# Patient Record
Sex: Male | Born: 1995 | Hispanic: Refuse to answer | Marital: Single | Smoking: Never smoker
Health system: Southern US, Community
[De-identification: ages and names within clinical notes are randomized; demographics above are authoritative.]

---

## 2018-05-01 ENCOUNTER — Encounter: Payer: Self-pay | Admitting: Family Medicine

## 2018-05-01 ENCOUNTER — Ambulatory Visit
Admission: RE | Admit: 2018-05-01 | Discharge: 2018-05-01 | Disposition: A | Discharge status: Home / Self Care | Payer: BLUE CROSS/BLUE SHIELD | Attending: Family Medicine | Admitting: Family Medicine

## 2018-05-01 ENCOUNTER — Ambulatory Visit (INDEPENDENT_AMBULATORY_CARE_PROVIDER_SITE_OTHER): Payer: BLUE CROSS/BLUE SHIELD | Admitting: Family Medicine

## 2018-05-01 ENCOUNTER — Ambulatory Visit
Admission: RE | Admit: 2018-05-01 | Discharge: 2018-05-01 | Disposition: A | Discharge status: Home / Self Care | Payer: BLUE CROSS/BLUE SHIELD | Source: Ambulatory Visit | Attending: Family Medicine | Admitting: Family Medicine

## 2018-05-01 VITALS — Resp 14

## 2018-05-01 DIAGNOSIS — M25531 Pain in right wrist: Secondary | ICD-10-CM

## 2018-05-01 DIAGNOSIS — S6990XA Unspecified injury of unspecified wrist, hand and finger(s), initial encounter: Secondary | ICD-10-CM | POA: Diagnosis present

## 2018-05-01 MED ORDER — NAPROXEN 500 MG PO TABS: 500.0000 mg | ORAL_TABLET | Freq: Two times a day (BID) | ORAL | 0 refills | Status: DC

## 2018-05-02 NOTE — Progress Notes (Signed)
Patient presents with symptoms of right wrist pain. He denies any trauma to the wrist. He admits that he had some soreness around his wrist with playing tennis over the last few days (which he states is not unusual for him) but then yesterday he went to hit backhand and had increased pain along the dorsal aspect of his right mid wrist. He admits to a hx of a right scaphoid fracture several years ago which he was casted for. He denies any problems since. He has noticed some bruising and swelling of the area. He has been icing.   ROS: Negative except mentioned above. Vitals as per Epic.  GENERAL: NAD MSK: R Wrist - mild swelling and pain along the medial aspect of the wrist, no significant pain at the snuff box, mild discomfort with extension and flexion of the wrist, flexor/extensor tendons intact, nv intact  NEURO: CN II-XII grossly intact   A/P: R Wrist Injury - will get Xrays, recommend wrist splint, ice a few times a day, NSAIDs prn, can do other activity that doesn't involve right upper extremity, if symptoms don't improve into next week will consider f/u with Dr. Ardine Eng and/or further imaging. Plan discussed with patient and trainer.

## 2018-05-04 ENCOUNTER — Other Ambulatory Visit: Payer: Self-pay | Admitting: Family Medicine

## 2018-05-04 ENCOUNTER — Ambulatory Visit
Admission: RE | Admit: 2018-05-04 | Discharge: 2018-05-04 | Disposition: A | Discharge status: Home / Self Care | Payer: BLUE CROSS/BLUE SHIELD | Source: Ambulatory Visit | Attending: Family Medicine | Admitting: Family Medicine

## 2018-05-04 DIAGNOSIS — S6991XD Unspecified injury of right wrist, hand and finger(s), subsequent encounter: Secondary | ICD-10-CM

## 2018-05-29 ENCOUNTER — Encounter: Payer: Self-pay | Admitting: Family Medicine

## 2018-05-29 ENCOUNTER — Ambulatory Visit (INDEPENDENT_AMBULATORY_CARE_PROVIDER_SITE_OTHER): Payer: BLUE CROSS/BLUE SHIELD | Admitting: Family Medicine

## 2018-05-29 VITALS — BP 126/75 | HR 77 | Temp 98.3°F | Resp 14

## 2018-05-29 DIAGNOSIS — J029 Acute pharyngitis, unspecified: Secondary | ICD-10-CM

## 2018-05-29 NOTE — Progress Notes (Signed)
Patient presents today with symptoms of sore throat, fever, headache, nasal congestion, body aches, fatigue.  Patient states that he has had symptoms for the last 4 days.  He states that his sore throat is about a 6 out of 10.  He has not taken any medications today.  He denies having a history of Infectious Mono.  He denies any international travel recently.  He denies any chest pain, shortness of breath, nausea, vomiting, diarrhea, abdominal pain, neck pain, photophobia.  ROS: Negative except mentioned above. Those as per Epic. GENERAL: NAD HEENT: moderate pharyngeal erythema, no exudate, no erythema of TMs, shotty cervical LAD RESP: CTA B CARD: RRR NEURO: CN II-XII grossly intact   A/P: Pharyngitis, URI - Flu screen and Rapid strep test were negative in the office, CBC and Monospot test drawn, Tylenol/Ibuprofen as needed, rest, hydration, no athletic activity for now until lab results have been reviewed., seek medical attention if symptoms persist or worsen as discussed.

## 2018-05-30 LAB — CBC WITH DIFFERENTIAL/PLATELET
Basophils Absolute: 0 10*3/uL (ref 0.0–0.2)
Basos: 1 %
EOS (ABSOLUTE): 0.2 10*3/uL (ref 0.0–0.4)
Eos: 4 %
Hematocrit: 45.8 % (ref 37.5–51.0)
Hemoglobin: 16 g/dL (ref 13.0–17.7)
Immature Grans (Abs): 0 10*3/uL (ref 0.0–0.1)
Immature Granulocytes: 0 %
LYMPHS: 12 %
Lymphocytes Absolute: 0.5 10*3/uL — ABNORMAL LOW (ref 0.7–3.1)
MCH: 32.1 pg (ref 26.6–33.0)
MCHC: 34.9 g/dL (ref 31.5–35.7)
MCV: 92 fL (ref 79–97)
Monocytes Absolute: 0.5 10*3/uL (ref 0.1–0.9)
Monocytes: 12 %
Neutrophils Absolute: 2.8 10*3/uL (ref 1.4–7.0)
Neutrophils: 71 %
Platelets: 131 10*3/uL — ABNORMAL LOW (ref 150–450)
RBC: 4.98 x10E6/uL (ref 4.14–5.80)
RDW: 12.1 % (ref 11.6–15.4)
WBC: 3.9 10*3/uL (ref 3.4–10.8)

## 2018-05-30 LAB — MONONUCLEOSIS SCREEN: Mono Screen: NEGATIVE

## 2018-12-12 ENCOUNTER — Other Ambulatory Visit: Payer: Self-pay | Admitting: Family Medicine

## 2018-12-12 DIAGNOSIS — Z8619 Personal history of other infectious and parasitic diseases: Secondary | ICD-10-CM

## 2018-12-12 DIAGNOSIS — Z8616 Personal history of COVID-19: Secondary | ICD-10-CM

## 2018-12-14 ENCOUNTER — Ambulatory Visit (INDEPENDENT_AMBULATORY_CARE_PROVIDER_SITE_OTHER): Payer: BC Managed Care – PPO

## 2018-12-14 ENCOUNTER — Other Ambulatory Visit: Payer: Self-pay

## 2018-12-14 DIAGNOSIS — Z8616 Personal history of COVID-19: Secondary | ICD-10-CM

## 2018-12-14 DIAGNOSIS — Z8619 Personal history of other infectious and parasitic diseases: Secondary | ICD-10-CM | POA: Diagnosis not present

## 2018-12-31 ENCOUNTER — Other Ambulatory Visit: Payer: Self-pay

## 2018-12-31 ENCOUNTER — Telehealth (INDEPENDENT_AMBULATORY_CARE_PROVIDER_SITE_OTHER): Payer: BC Managed Care – PPO | Admitting: Family Medicine

## 2018-12-31 DIAGNOSIS — R5383 Other fatigue: Secondary | ICD-10-CM

## 2018-12-31 DIAGNOSIS — R0602 Shortness of breath: Secondary | ICD-10-CM

## 2018-12-31 NOTE — Progress Notes (Signed)
Virtual Visit Agreed To By Patient Patient states that he had COVID-19 at the end of June. His symptoms consisted of nasal congestion and loss of taste. He denied any cardiopulmonary symptoms or fever. He did no physical activity for about 3 weeks after his diagnosis. When he started practice at [employer] [employer] he felt he was out of shape initially. It however has been a few weeks now and still feels the same. He has been practicing 2 hours a day for a few weeks now. He describes his symptoms as feeling unusually fatigued after playing a long point. He denies any chest pain, palpitations, headache, syncope, extremity swelling/pain or shortness of breath. He denies feeling fatigued outside of athletic activity. He has been eating and sleeping well. He denies taking any new medications or supplements. He denies smoking.  Patient had a ECG and Echo recently to return back to collegiate physical activity given his history of COVID-19 this summer. No significant abnormalities identified.  Admits to family history of PE in paternal grandfather and DVT in father. Patient has not had a hypercoag. workup and doesn't feel his father has.   ROS: Negative except mentioned above. GENERAL: NAD NEURO: CN II-XII grossly intact    A/P: Fatigue with physical activity - unclear whether related to having COVID-19 over the summer, will order CT Chest given symptoms and family history, patient is to go to the ER or call 911 if symptoms worsen as discussed, will consider pulmonary and/or cardiology referral after CT results are reviewed. No athletic activity until CT results reviewed. Discussed plan with trainer as well.

## 2019-01-01 ENCOUNTER — Ambulatory Visit: Payer: PRIVATE HEALTH INSURANCE | Attending: Family Medicine

## 2019-01-01 ENCOUNTER — Ambulatory Visit: Admission: RE | Admit: 2019-01-01 | Payer: PRIVATE HEALTH INSURANCE | Source: Ambulatory Visit

## 2019-01-04 ENCOUNTER — Other Ambulatory Visit: Payer: Self-pay | Admitting: Family Medicine

## 2019-01-07 ENCOUNTER — Telehealth (INDEPENDENT_AMBULATORY_CARE_PROVIDER_SITE_OTHER): Payer: BC Managed Care – PPO | Admitting: Family Medicine

## 2019-01-07 DIAGNOSIS — Z7189 Other specified counseling: Secondary | ICD-10-CM

## 2019-01-09 ENCOUNTER — Other Ambulatory Visit: Payer: Self-pay

## 2019-01-14 ENCOUNTER — Other Ambulatory Visit: Payer: Self-pay | Admitting: Family Medicine

## 2019-01-14 DIAGNOSIS — Z8619 Personal history of other infectious and parasitic diseases: Secondary | ICD-10-CM

## 2019-01-14 DIAGNOSIS — R5383 Other fatigue: Secondary | ICD-10-CM

## 2019-01-14 DIAGNOSIS — Z8616 Personal history of COVID-19: Secondary | ICD-10-CM

## 2019-01-17 ENCOUNTER — Institutional Professional Consult (permissible substitution): Payer: PRIVATE HEALTH INSURANCE | Admitting: Pulmonary Disease

## 2019-01-18 ENCOUNTER — Other Ambulatory Visit: Payer: Self-pay

## 2019-01-18 ENCOUNTER — Ambulatory Visit (INDEPENDENT_AMBULATORY_CARE_PROVIDER_SITE_OTHER): Payer: BC Managed Care – PPO | Admitting: Internal Medicine

## 2019-01-18 ENCOUNTER — Other Ambulatory Visit
Admission: RE | Admit: 2019-01-18 | Discharge: 2019-01-18 | Disposition: A | Discharge status: Home / Self Care | Payer: BC Managed Care – PPO | Source: Ambulatory Visit | Attending: Internal Medicine | Admitting: Internal Medicine

## 2019-01-18 ENCOUNTER — Encounter: Payer: Self-pay | Admitting: Internal Medicine

## 2019-01-18 VITALS — BP 128/70 | HR 82 | Temp 98.0°F | Ht 76.0 in | Wt 189.8 lb

## 2019-01-18 DIAGNOSIS — U071 COVID-19: Secondary | ICD-10-CM | POA: Insufficient documentation

## 2019-01-18 DIAGNOSIS — J4520 Mild intermittent asthma, uncomplicated: Secondary | ICD-10-CM

## 2019-01-18 LAB — CBC WITH DIFFERENTIAL/PLATELET
Abs Immature Granulocytes: 0.02 10*3/uL (ref 0.00–0.07)
Basophils Absolute: 0.1 10*3/uL (ref 0.0–0.1)
Basophils Relative: 1 %
Eosinophils Absolute: 0.1 10*3/uL (ref 0.0–0.5)
Eosinophils Relative: 2 %
HCT: 44.8 % (ref 39.0–52.0)
Hemoglobin: 15.4 g/dL (ref 13.0–17.0)
Immature Granulocytes: 0 %
Lymphocytes Relative: 33 %
Lymphs Abs: 2.2 10*3/uL (ref 0.7–4.0)
MCH: 31.9 pg (ref 26.0–34.0)
MCHC: 34.4 g/dL (ref 30.0–36.0)
MCV: 92.8 fL (ref 80.0–100.0)
Monocytes Absolute: 0.5 10*3/uL (ref 0.1–1.0)
Monocytes Relative: 7 %
Neutro Abs: 3.8 10*3/uL (ref 1.7–7.7)
Neutrophils Relative %: 57 %
Platelets: 172 10*3/uL (ref 150–400)
RBC: 4.83 MIL/uL (ref 4.22–5.81)
RDW: 12.3 % (ref 11.5–15.5)
WBC: 6.7 10*3/uL (ref 4.0–10.5)
nRBC: 0 % (ref 0.0–0.2)

## 2019-01-18 MED ORDER — ALBUTEROL SULFATE HFA 108 (90 BASE) MCG/ACT IN AERS: 2.0000 | INHALATION_SPRAY | RESPIRATORY_TRACT | 6 refills | Status: AC | PRN

## 2019-01-18 NOTE — Progress Notes (Signed)
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 Recent diagnosis of COVID-19 infection Started off with sore throat congestion with loss of taste and smell Over the last several weeks has progressed to shortness of breath and dyspnea on exertion most notably with extreme exercise  Patient is not short of breath at rest He denies wheezing He denies cough  Patient was sent to [facility] for CT of his chest to rule out PE There was no evidence of pulmonary embolism There was no evidence of interstitial lung disease There was evidence of calcified lymph nodes in the mediastinal area No significant findings on CAT scan  Patient does have some intermittent chest pain as related with movement It is nonsustained There is no other symptoms Chest pressure relieved some of the pain  Patient does not smoke Does not do drugs Does not use alcohol No secondhand smoke exposure  No previous heart and lung disease Up-to-date on vaccinations     PAST MEDICAL HISTORY :  COVID 19 INFECTION  No Known Allergies  FAMILY HISTORY:  family history includes Cancer in his paternal aunt. SOCIAL HISTORY:  reports that he has never smoked. He has never used smokeless tobacco. He reports current alcohol use.    Review of Systems:  Gen:  Denies  fever, sweats, chills weigh loss  HEENT: Denies blurred vision, double vision, ear pain, eye pain, hearing loss, nose bleeds, sore throat Cardiac:  No dizziness, +chest pain or heaviness, chest tightness,edema, No JVD Resp:   No cough, -sputum production, +shortness of breath,-wheezing, -hemoptysis,  Gi: Denies swallowing difficulty, stomach pain, nausea or vomiting, diarrhea, constipation, bowel incontinence Gu:  Denies bladder incontinence, burning urine Ext:    Denies Joint pain, stiffness or swelling Skin: Denies  skin rash, easy bruising or bleeding or hives Endoc:  Denies polyuria, polydipsia , polyphagia or weight change Psych:   Denies depression, insomnia or hallucinations  Other:  All other systems negative   BP 128/70   Pulse 82   Temp 98 F (36.7 C) (Temporal)   Ht 6\' 4"  (1.93 m)   Wt 189 lb 12.8 oz (86.1 kg)   SpO2 97%   BMI 23.10 kg/m    SpO2: 97 %    Physical Examination:   GENERAL:NAD, no fevers, chills, no weakness no fatigue HEAD: Normocephalic, atraumatic.  EYES: PERLA, EOMI No scleral icterus.  MOUTH: Moist mucosal membrane.  EAR, NOSE, THROAT: Clear without exudates. No external lesions.  NECK: Supple.  PULMONARY: CTA B/L no wheezing, rhonchi, crackles CARDIOVASCULAR: S1 and S2. Regular rate and rhythm. No murmurs GASTROINTESTINAL: Soft, nontender, nondistended. Positive bowel sounds.  MUSCULOSKELETAL: No swelling, clubbing, or edema.  NEUROLOGIC: No gross focal neurological deficits. 5/5 strength all extremities SKIN: No ulceration, lesions, rashes, or cyanosis.  PSYCHIATRIC: Insight, judgment intact. -depression -anxiety ALL OTHER ROS ARE NEGATIVE   MEDICATIONS: I have reviewed all medications and confirmed regimen as documented  OUTSIDE CT CHEST  IMAGES reviewed by me today No evidence of pulmonary embolus There may be some areas of mosaic ism and attenuation which could represent air trapping There is some very mild calcified mediastinal adenopathy There may be some evidence of mosaic and attenuation that could represent air trapping  OUTSDIE     ASSESSMENT AND PLAN SYNOPSIS 23 year old white male with recent COVID-19 infection with  normal CT chest findings of the lungs and no PE with progressive shortness of breath and dyspnea on exertion with probable underlying chest pain from costochondritis, patient may also have underlying air trapping and reactive airways disease  At this time I recommend  albuterol 2 to 4 puffs prior to exercise and exertion I also recommend obtaining pulmonary function testing I also recommend obtaining CBC Chem-7 and LFTs to assess for fatigue  Albuterol education being provided For his costochondritis I would recommend Motrin 800 mg 3 times a day with food as needed   COVID-19 EDUCATION: The signs and symptoms of COVID-19 were discussed with the patient and how to seek care for testing.  The importance of social distancing was discussed today. Hand Washing Techniques and avoid touching face was advised.     MEDICATION ADJUSTMENTS/LABS AND TESTS ORDERED: START ALBUTEROL INH 2-4 PUFFS PRIOR TO EXERCISE CHECK LAB WORK(KIDNEYS, LIVER, check for Anemia) MOTRIN 800 mg every 8 hrs as needed with food DX of COSTOCHONDRITIS-inflammation of cartilage OBTAIN PFT(breathing Tests) Call us if chest pain persists    CURRENT MEDICATIONS REVIEWED AT LENGTH WITH PATIENT TODAY   Patient satisfied with Plan of action and management. All questions answered  Follow up in 4-6 weeks   Adriyanna Christians Patricia Pesa, M.D.  [facility]  Medical Director [place] Director [facility] Cardio-Pulmonary Department

## 2019-01-18 NOTE — Patient Instructions (Addendum)
START ALBUTEROL INH 2-4 PUFFS PRIOR TO EXERCISE  CHECK LAB WORK(KIDNEYS, LIVER, check for Anemia)  MOTRIN 800 mg every 8 hrs as needed with food DX of COSTOCHONDRITIS-inflammation of cartilage   OBTAIN PFT(breathing Tests)  Call us if chest pain persists

## 2019-01-23 NOTE — Progress Notes (Signed)
Virtual Visit Agreed To By Patient  Patient has no complaints at this time. Has not been doing any physical activity since our last virtual visit. Today's appointment was made to discuss any interval change and discuss CT results. The CT Chest did not reveal a PE. There were some other findings per the radiology reading. I have recommended he follow-up with Pulmonary to discuss further. Patient addresses understanding. Denies any questions at this time.

## 2019-02-04 ENCOUNTER — Telehealth: Payer: Self-pay

## 2019-02-04 NOTE — Telephone Encounter (Signed)
Left message to relay date/time of covid test.  02/06/2019 prior to 11:[facility]  

## 2019-02-05 ENCOUNTER — Other Ambulatory Visit: Payer: Self-pay

## 2019-02-05 NOTE — Telephone Encounter (Signed)
Left message for pt

## 2019-02-05 NOTE — Telephone Encounter (Signed)
Patient called back - pt is aware of appt date and time -pr

## 2019-02-06 ENCOUNTER — Other Ambulatory Visit: Payer: Self-pay

## 2019-02-06 ENCOUNTER — Other Ambulatory Visit
Admission: RE | Admit: 2019-02-06 | Discharge: 2019-02-06 | Disposition: A | Discharge status: Home / Self Care | Payer: BC Managed Care – PPO | Source: Ambulatory Visit | Attending: Internal Medicine | Admitting: Internal Medicine

## 2019-02-06 DIAGNOSIS — Z01812 Encounter for preprocedural laboratory examination: Secondary | ICD-10-CM | POA: Diagnosis not present

## 2019-02-06 DIAGNOSIS — Z20828 Contact with and (suspected) exposure to other viral communicable diseases: Secondary | ICD-10-CM | POA: Insufficient documentation

## 2019-02-06 NOTE — Telephone Encounter (Signed)
Pt called and stated that he is on his way for COVID Test and was late due to having class at [facility].  Pt did arrive to COVID Test and called and left message for Cardiopulmonary to place patient back on schedule for PFT 02/07/2019 at 7:00. Rhonda J Cobb

## 2019-02-06 NOTE — Telephone Encounter (Signed)
Per Katie with PAT, pt no showed covid test. Suanne Marker please advise.

## 2019-02-06 NOTE — Telephone Encounter (Signed)
PFT has been placed back on schedule for 02/07/2019 at 7:00 am with an arrival time of 6:45 am.   Pt is aware. Rhonda J Cobb

## 2019-02-07 ENCOUNTER — Ambulatory Visit: Payer: BC Managed Care – PPO | Attending: Internal Medicine

## 2019-02-07 ENCOUNTER — Ambulatory Visit: Payer: PRIVATE HEALTH INSURANCE

## 2019-02-07 DIAGNOSIS — U071 COVID-19: Secondary | ICD-10-CM | POA: Diagnosis not present

## 2019-02-07 DIAGNOSIS — J4520 Mild intermittent asthma, uncomplicated: Secondary | ICD-10-CM

## 2019-02-07 LAB — SARS CORONAVIRUS 2 (TAT 6-24 HRS): SARS Coronavirus 2: NEGATIVE

## 2019-02-07 MED ORDER — ALBUTEROL SULFATE (2.5 MG/3ML) 0.083% IN NEBU
2.5000 mg | INHALATION_SOLUTION | Freq: Once | RESPIRATORY_TRACT | Status: AC
  Administered 2019-02-07: 2.5 mg via RESPIRATORY_TRACT
  Filled 2019-02-07: qty 3

## 2019-04-30 ENCOUNTER — Ambulatory Visit: Payer: PRIVATE HEALTH INSURANCE

## 2019-05-09 ENCOUNTER — Ambulatory Visit: Payer: PRIVATE HEALTH INSURANCE

## 2019-05-16 ENCOUNTER — Ambulatory Visit: Payer: PRIVATE HEALTH INSURANCE

## 2020-01-04 IMAGING — CR DG WRIST COMPLETE 3+V*R*
1 series · 4 of 4 positions shown · non-contrast
Comparison: None.

CLINICAL DATA: Pt states he was playing tennis yesterday and did a
backhand motion and started having pain middle of right wrist. Pt
states he had a stress fracure of the scaphoid 7 years ago.
shieldedtennis player mid wrist pain after backhand motion, evaluate
for bony abnormality

EXAM:
RIGHT WRIST - COMPLETE 3+ VIEW

[Series 1: dg wrist complete right · 0.14mm/px · 4 of 4 slices shown]
[im 1/4]
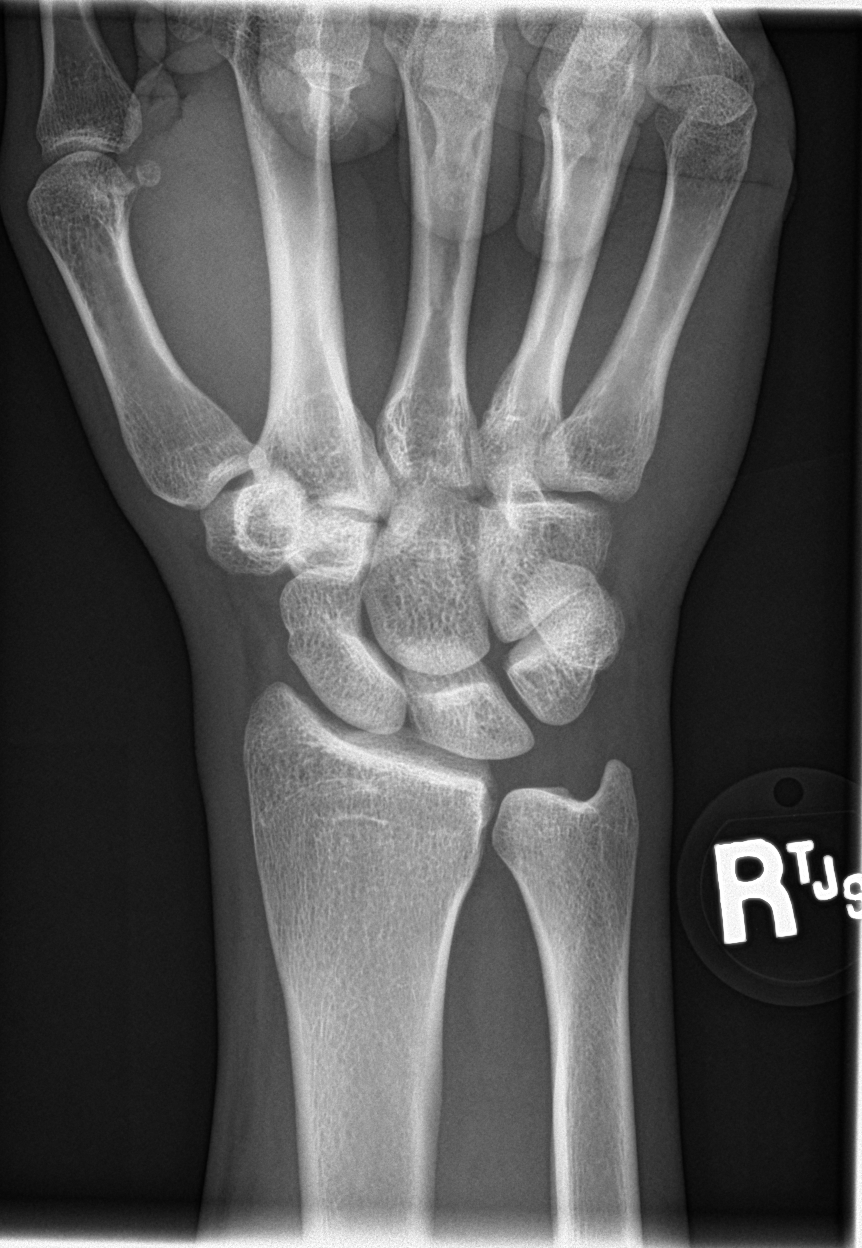
[im 2/4]
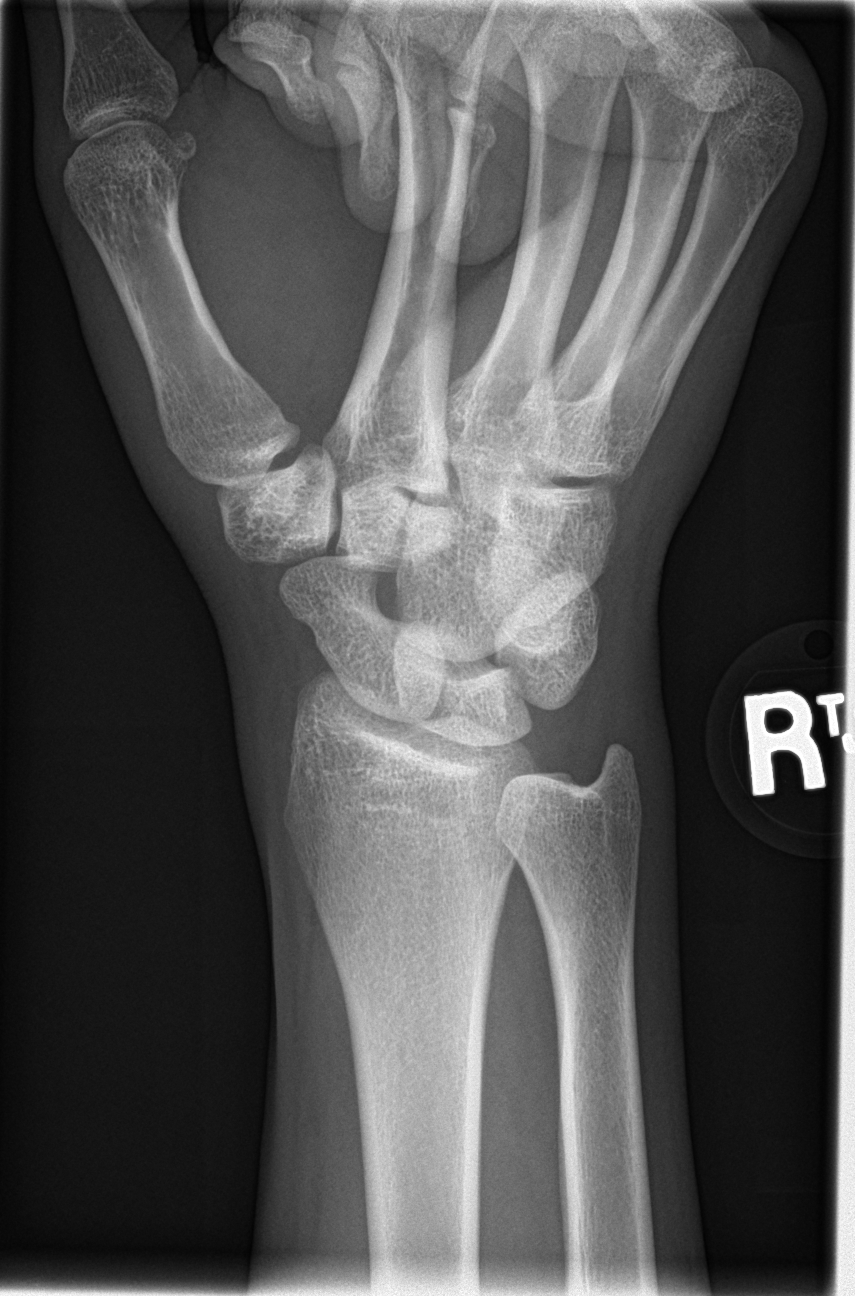
[im 3/4]
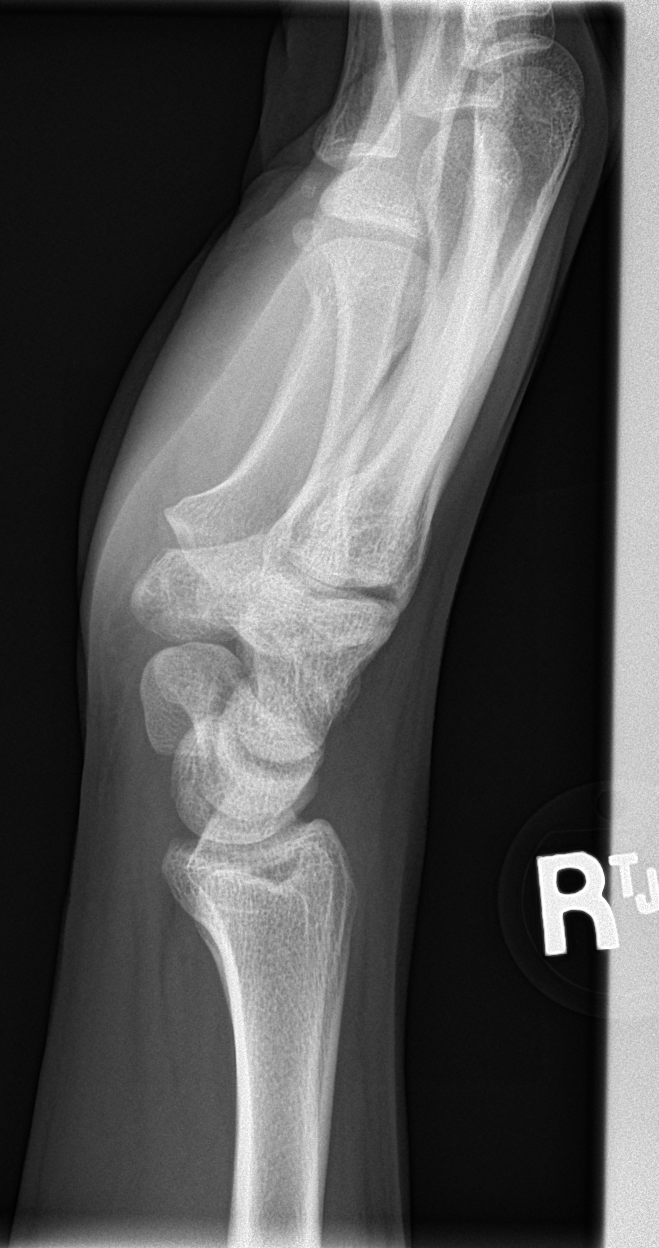
[im 4/4]
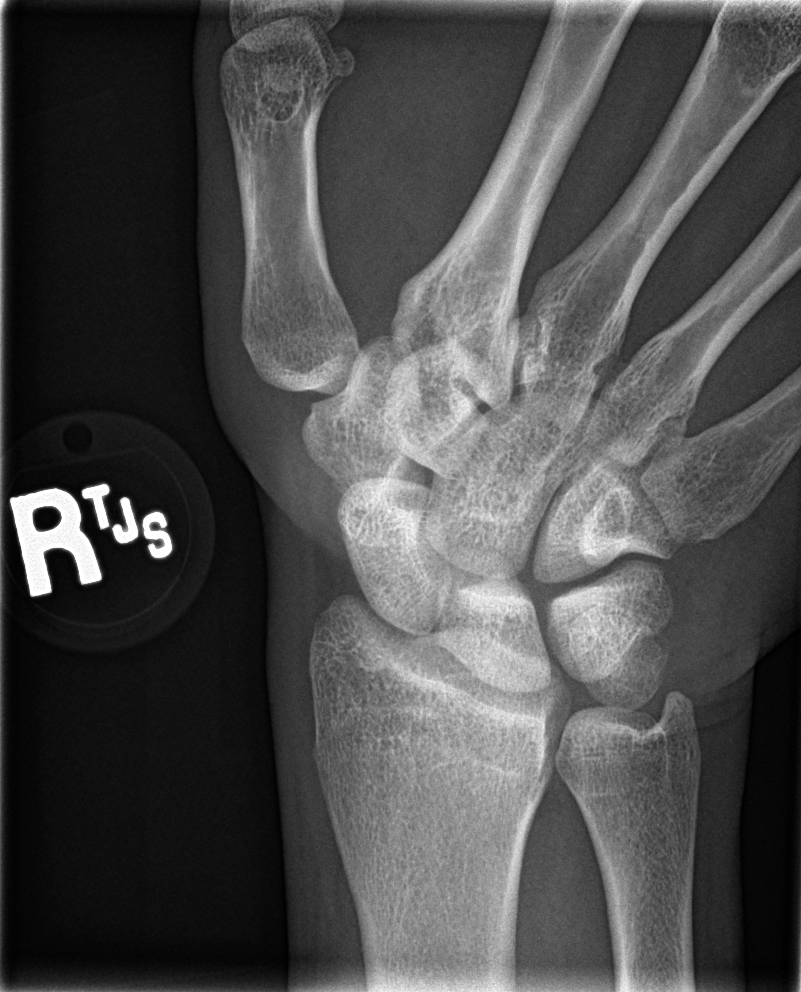

[4 of 4 positions shown; findings below may reference images not displayed]

FINDINGS: No distal radius or ulnar fracture. Radiocarpal joint is intact. No
carpal fracture. No soft tissue abnormality.
IMPRESSION: No fracture or dislocation.
